# Patient Record
Sex: Female | Born: 1990 | Race: Black or African American | Hispanic: No | Marital: Single | Smoking: Current some day smoker
Health system: Southern US, Community
[De-identification: ages and names within clinical notes are randomized; demographics above are authoritative.]

## PROBLEM LIST (undated history)

## (undated) DIAGNOSIS — G930 Cerebral cysts: Secondary | ICD-10-CM

---

## 2010-11-11 ENCOUNTER — Emergency Department (INDEPENDENT_AMBULATORY_CARE_PROVIDER_SITE_OTHER): Payer: Self-pay

## 2010-11-11 ENCOUNTER — Emergency Department (HOSPITAL_BASED_OUTPATIENT_CLINIC_OR_DEPARTMENT_OTHER)
Admission: EM | Admit: 2010-11-11 | Discharge: 2010-11-11 | Disposition: A | Discharge status: Home / Self Care | Payer: Self-pay | Attending: Emergency Medicine | Admitting: Emergency Medicine

## 2010-11-11 DIAGNOSIS — N390 Urinary tract infection, site not specified: Secondary | ICD-10-CM | POA: Insufficient documentation

## 2010-11-11 DIAGNOSIS — A599 Trichomoniasis, unspecified: Secondary | ICD-10-CM | POA: Insufficient documentation

## 2010-11-11 DIAGNOSIS — R51 Headache: Secondary | ICD-10-CM | POA: Insufficient documentation

## 2010-11-11 DIAGNOSIS — R109 Unspecified abdominal pain: Secondary | ICD-10-CM

## 2010-11-11 LAB — URINE MICROSCOPIC-ADD ON

## 2010-11-11 LAB — URINALYSIS, ROUTINE W REFLEX MICROSCOPIC
Glucose, UA: NEGATIVE mg/dL
Ketones, ur: NEGATIVE mg/dL
pH: 6.5 (ref 5.0–8.0)

## 2011-09-03 ENCOUNTER — Encounter (HOSPITAL_COMMUNITY): Payer: Self-pay | Admitting: Emergency Medicine

## 2011-09-03 ENCOUNTER — Emergency Department (HOSPITAL_COMMUNITY)
Admission: EM | Admit: 2011-09-03 | Discharge: 2011-09-03 | Disposition: A | Discharge status: Home / Self Care | Payer: Self-pay | Attending: Emergency Medicine | Admitting: Emergency Medicine

## 2011-09-03 DIAGNOSIS — X58XXXA Exposure to other specified factors, initial encounter: Secondary | ICD-10-CM | POA: Insufficient documentation

## 2011-09-03 DIAGNOSIS — L03019 Cellulitis of unspecified finger: Secondary | ICD-10-CM

## 2011-09-03 DIAGNOSIS — S6980XA Other specified injuries of unspecified wrist, hand and finger(s), initial encounter: Secondary | ICD-10-CM | POA: Insufficient documentation

## 2011-09-03 DIAGNOSIS — G930 Cerebral cysts: Secondary | ICD-10-CM | POA: Insufficient documentation

## 2011-09-03 DIAGNOSIS — S6990XA Unspecified injury of unspecified wrist, hand and finger(s), initial encounter: Secondary | ICD-10-CM | POA: Insufficient documentation

## 2011-09-03 DIAGNOSIS — F17200 Nicotine dependence, unspecified, uncomplicated: Secondary | ICD-10-CM | POA: Insufficient documentation

## 2011-09-03 HISTORY — DX: Cerebral cysts: G93.0

## 2011-09-03 MED ORDER — IBUPROFEN 800 MG PO TABS: 800.0000 mg | ORAL_TABLET | Freq: Three times a day (TID) | ORAL | Status: AC

## 2011-09-03 MED ORDER — CEPHALEXIN 500 MG PO CAPS: 500.0000 mg | ORAL_CAPSULE | Freq: Four times a day (QID) | ORAL | Status: AC

## 2011-09-03 NOTE — ED Notes (Signed)
Left hand middle finger with slight edema and redness x few days per patient.  Patient states pain increasing into arm and into underarm today.  Patient denies injury.

## 2011-09-03 NOTE — Discharge Instructions (Signed)

## 2011-09-03 NOTE — ED Provider Notes (Signed)
History     CSN: [id number]  Arrival date & time 09/03/11  1603   First MD Initiated Contact with Patient 09/03/11 1805      Chief Complaint  Patient presents with  . Finger Injury    (Consider location/radiation/quality/duration/timing/severity/associated sxs/prior treatment) HPI  Finger pain:  Patient complains of redness and swelling to the left third digit. She is right-handed She did not know if she may have been stung by an insect or bug. She has had no trauma or injuries. She's had no fevers. She's had no weakness, nausea, or vomiting. Does not take anything for her pain because "the pain wasn't bad and " she's had no similar previous symptoms. There was a series small purple mark on the radial side of the left third finger. The swelling has been very, mild there's been no obvious streaking or lymphangitis. Patient believes her tetanus is up-to-date.    Past Medical History  Diagnosis Date  . Brain cyst     History reviewed. No pertinent past surgical history.  History reviewed. No pertinent family history.  History  Substance Use Topics  . Smoking status: Current Some Day Smoker  . Smokeless tobacco: Not on file  . Alcohol Use: Yes     occasional    OB History    Grav Para Term Preterm Abortions TAB SAB Ect Mult Living                  Review of Systems  All other systems reviewed and are negative.    Allergies  Review of patient's allergies indicates no known allergies.  Home Medications   Current Outpatient Rx  Name Route Sig Dispense Refill  . IBUPROFEN 200 MG PO TABS Oral Take 400 mg by mouth every 6 (six) hours as needed. For headaches      BP 138/88  Pulse 103  Temp(Src) 98.8 F (37.1 C) (Oral)  Resp 18  SpO2 96%  Physical Exam  Nursing note and vitals reviewed. Constitutional: She is oriented to person, place, and time. She appears well-developed and well-nourished.  HENT:  Head: Normocephalic and atraumatic.  Eyes: Conjunctivae  and EOM are normal. Pupils are equal, round, and reactive to light.  Neck: Neck supple.  Cardiovascular: Normal rate and regular rhythm.  Exam reveals no gallop and no friction rub.   No murmur heard. Pulmonary/Chest: Breath sounds normal. She has no wheezes. She has no rales. She exhibits no tenderness.  Abdominal: Soft. Bowel sounds are normal. She exhibits no distension. There is no tenderness. There is no rebound and no guarding.  Musculoskeletal: Normal range of motion.       There is very mild erythema to the radial side of the left third digit. There is a very small purple mark, approximately 1 mm. There is no significant edema. There is no obvious streaking or lymphangitis. There is no obvious fluctuance or purulence. No obvious abscess or paronychia. No obvious foreign body. Her range of motion is normal, no bony tenderness. Flexion and extension. His thumb without difficulty. Her pulses are normal.  Neurological: She is alert and oriented to person, place, and time. No cranial nerve deficit. Coordination normal.  Skin: Skin is warm and dry. No rash noted.  Psychiatric: She has a normal mood and affect.    ED Course  Procedures (including critical care time)  Labs Reviewed - No data to display No results found.   No diagnosis found.    MDM  Pt is seen and examined;  Initial history and physical completed.  Will follow.     Very mild erythema to the left third digit. Patient is afebrile. Differential would include mild early cellulitis, insect bite, herpetic whitlow;  range of motion is very normal. There is no obvious fluctuance or drainable fluid collection.  Plan: Will put patient on Keflex and ibuprofen and referred to the hand specialist for followup. This week. She was told to return to ED for any concerns or to be reexamined.      Rosielee Corporan A. Patrica Duel, MD 09/03/11 [id number]

## 2011-09-03 NOTE — ED Notes (Signed)
Pt c/o pain, swelling and redness to 4th digit on left hand with pain up left arm; pt sts noted sore on finger several days ago

## 2012-03-31 ENCOUNTER — Other Ambulatory Visit (HOSPITAL_COMMUNITY)
Admission: RE | Admit: 2012-03-31 | Discharge: 2012-03-31 | Disposition: A | Discharge status: Home / Self Care | Payer: 59 | Source: Ambulatory Visit | Attending: Obstetrics & Gynecology | Admitting: Obstetrics & Gynecology

## 2012-03-31 DIAGNOSIS — N760 Acute vaginitis: Secondary | ICD-10-CM | POA: Insufficient documentation

## 2012-03-31 DIAGNOSIS — Z113 Encounter for screening for infections with a predominantly sexual mode of transmission: Secondary | ICD-10-CM | POA: Insufficient documentation

## 2012-03-31 DIAGNOSIS — Z124 Encounter for screening for malignant neoplasm of cervix: Secondary | ICD-10-CM | POA: Insufficient documentation

## 2012-06-04 IMAGING — CR DG CHEST 2V
2 series · 2 of 2 positions shown · non-contrast
Comparison: None available.

CLINICAL DATA: Right-sided flank pain.

CHEST - 2 VIEW

[w chest pa]
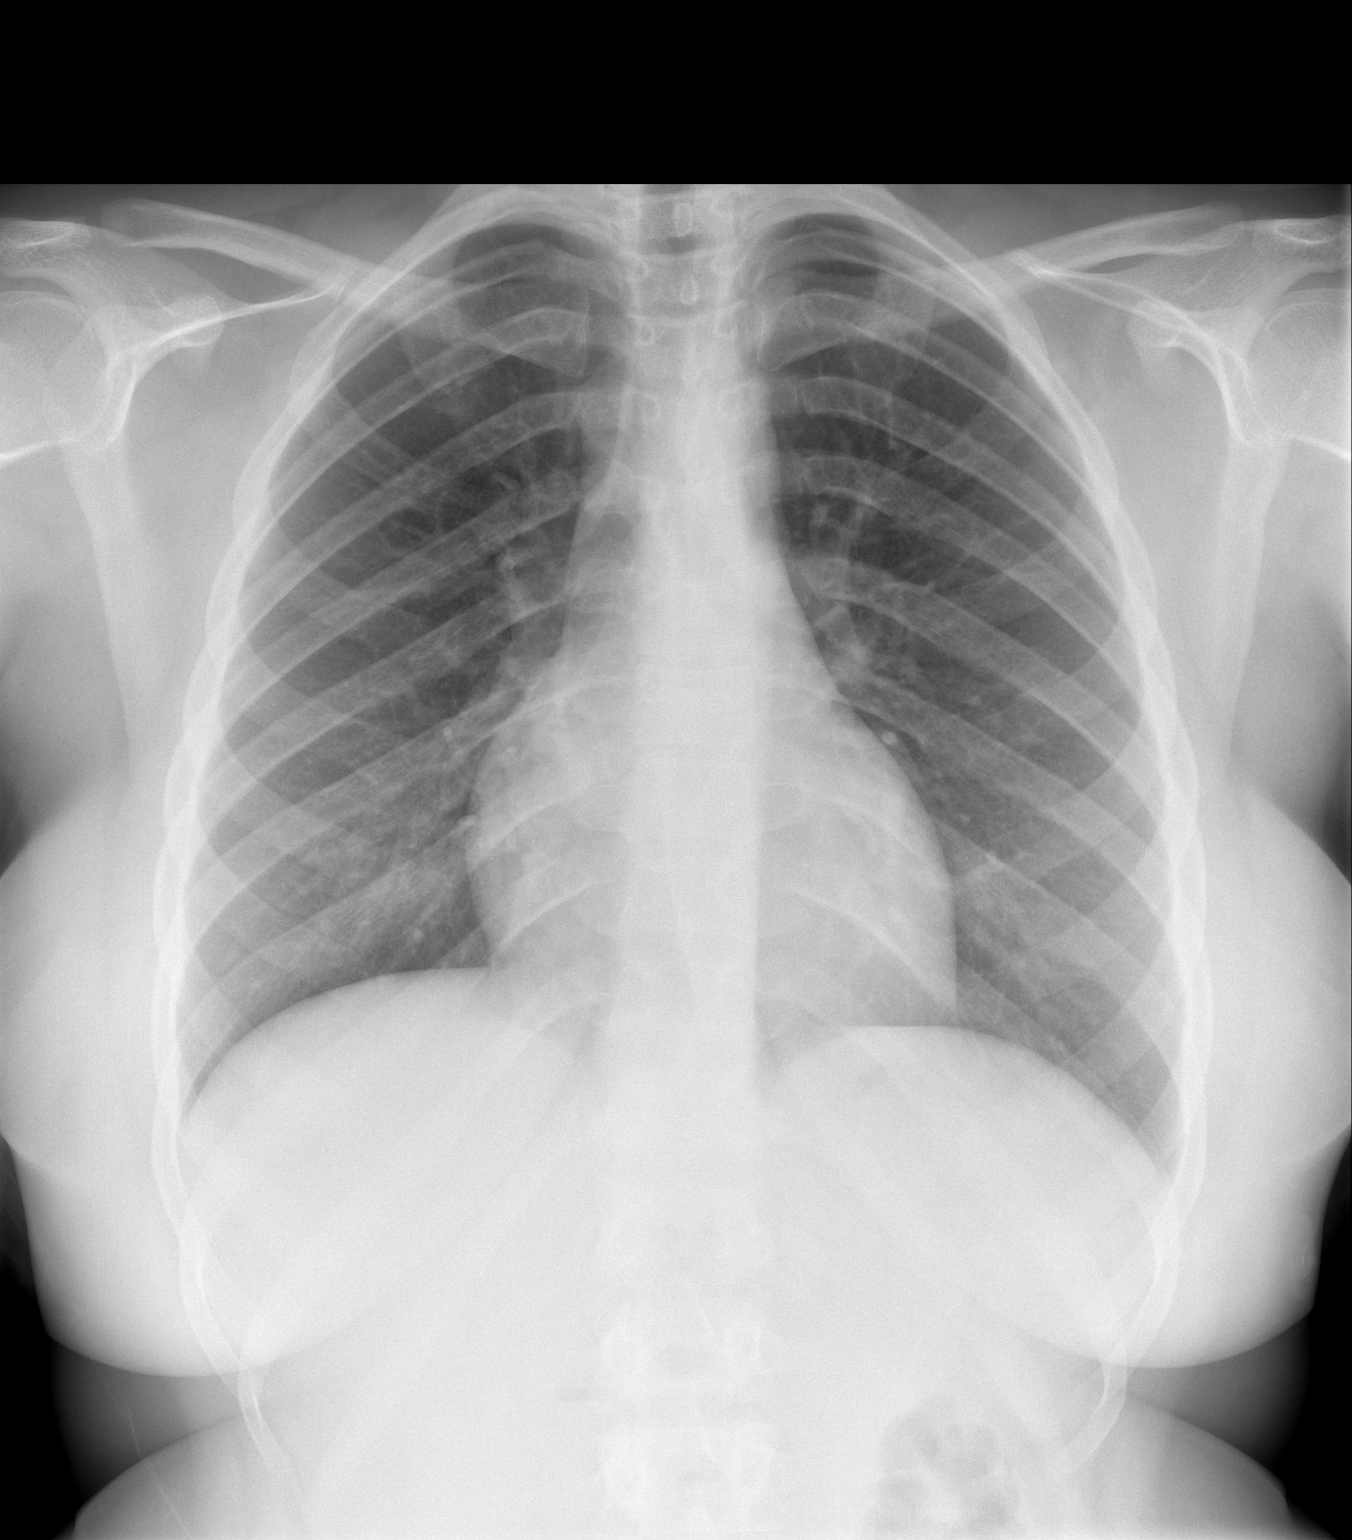

[w chest lat]
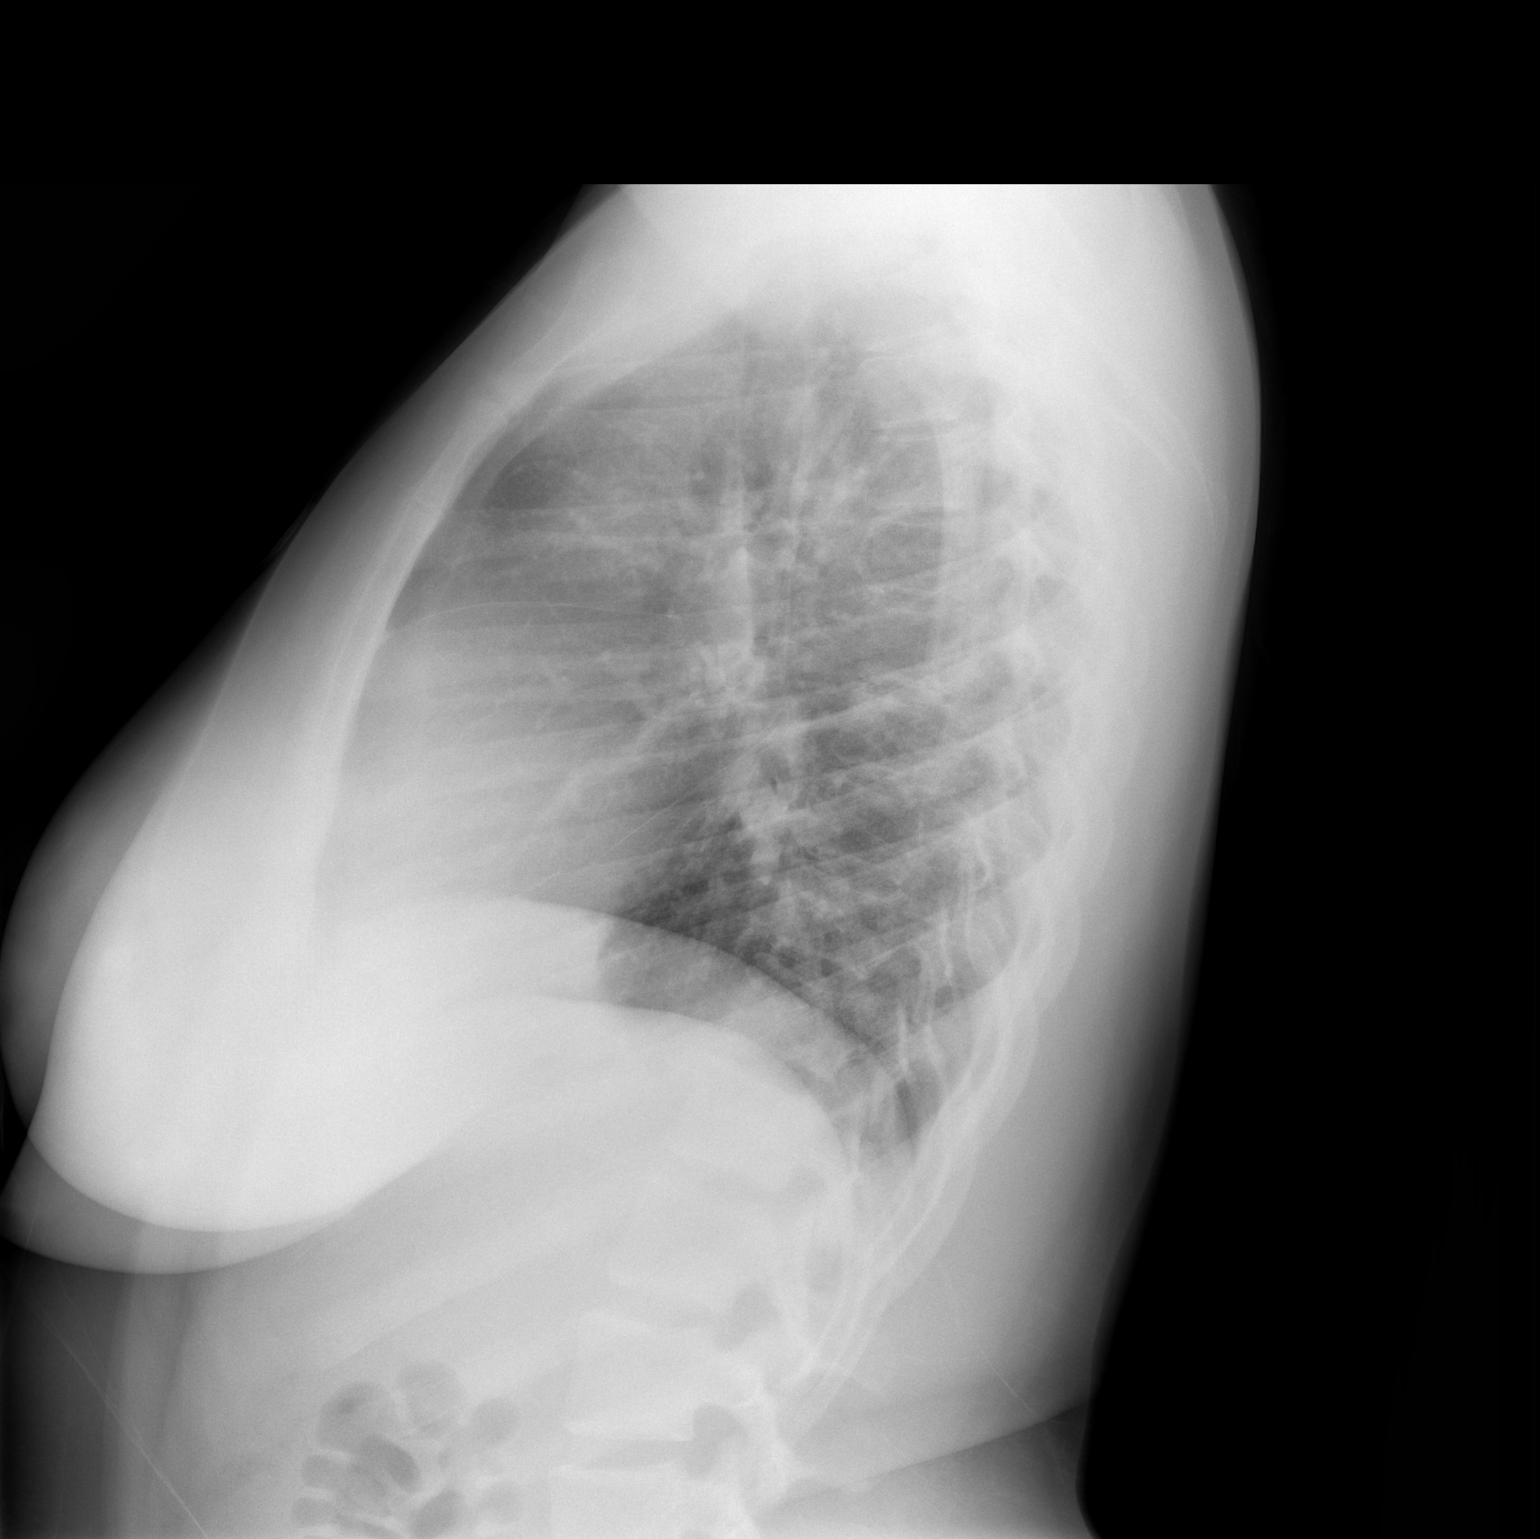

[2 of 2 positions shown; findings below may reference images not displayed]

FINDINGS: The heart size is normal.  The lungs are clear.  The
visualized soft tissues and bony thorax are unremarkable.
IMPRESSION: Negative chest.

## 2013-09-24 ENCOUNTER — Encounter (HOSPITAL_BASED_OUTPATIENT_CLINIC_OR_DEPARTMENT_OTHER): Payer: Self-pay | Admitting: Emergency Medicine

## 2013-09-24 DIAGNOSIS — M546 Pain in thoracic spine: Secondary | ICD-10-CM | POA: Insufficient documentation

## 2013-09-24 DIAGNOSIS — R21 Rash and other nonspecific skin eruption: Secondary | ICD-10-CM | POA: Insufficient documentation

## 2013-09-24 DIAGNOSIS — Z79899 Other long term (current) drug therapy: Secondary | ICD-10-CM | POA: Insufficient documentation

## 2013-09-24 DIAGNOSIS — N644 Mastodynia: Secondary | ICD-10-CM | POA: Insufficient documentation

## 2013-09-24 DIAGNOSIS — Z3202 Encounter for pregnancy test, result negative: Secondary | ICD-10-CM | POA: Insufficient documentation

## 2013-09-24 DIAGNOSIS — R071 Chest pain on breathing: Secondary | ICD-10-CM | POA: Insufficient documentation

## 2013-09-24 DIAGNOSIS — F17200 Nicotine dependence, unspecified, uncomplicated: Secondary | ICD-10-CM | POA: Insufficient documentation

## 2013-09-24 DIAGNOSIS — Z8669 Personal history of other diseases of the nervous system and sense organs: Secondary | ICD-10-CM | POA: Insufficient documentation

## 2013-09-24 DIAGNOSIS — Z792 Long term (current) use of antibiotics: Secondary | ICD-10-CM | POA: Insufficient documentation

## 2013-09-24 LAB — PREGNANCY, URINE: PREG TEST UR: NEGATIVE

## 2013-09-24 NOTE — ED Notes (Signed)
Left upper back and shoulder pain with tenderness x2 weeks.  Pain traveled to left rib area and around left breast 2-3 days ago. Tender to touch but no increase in pain with movement.

## 2013-09-25 ENCOUNTER — Emergency Department (HOSPITAL_BASED_OUTPATIENT_CLINIC_OR_DEPARTMENT_OTHER)
Admission: EM | Admit: 2013-09-25 | Discharge: 2013-09-25 | Disposition: A | Discharge status: Home / Self Care | Payer: No Typology Code available for payment source | Attending: Emergency Medicine | Admitting: Emergency Medicine

## 2013-09-25 ENCOUNTER — Emergency Department (HOSPITAL_BASED_OUTPATIENT_CLINIC_OR_DEPARTMENT_OTHER): Payer: No Typology Code available for payment source

## 2013-09-25 DIAGNOSIS — R0789 Other chest pain: Secondary | ICD-10-CM

## 2013-09-25 MED ORDER — METHOCARBAMOL 500 MG PO TABS: 500.0000 mg | ORAL_TABLET | Freq: Two times a day (BID) | ORAL | Status: DC

## 2013-09-25 MED ORDER — IBUPROFEN 600 MG PO TABS: 600.0000 mg | ORAL_TABLET | Freq: Four times a day (QID) | ORAL | Status: DC | PRN

## 2013-09-25 MED ORDER — DOXYCYCLINE HYCLATE 100 MG PO CAPS: 100.0000 mg | ORAL_CAPSULE | Freq: Two times a day (BID) | ORAL | Status: DC

## 2013-09-25 NOTE — Discharge Instructions (Signed)
We saw you in the ER for the chest pain. All the results in the ER are normal. WE SUSPECT THAT YOUR PAIN IS COMING FROM THE SKIN OR THE SOFT TISSUE OF THE BREAST. Take the meds prescribed. Ice the breast tissue 2-3 times a day. The workup in the ER is not complete, and is limited to screening for life threatening and emergent conditions only, so please see a primary care doctor for further evaluation.   Chest Wall Pain Chest wall pain is pain felt in or around the chest bones and muscles. It may take up to 6 weeks to get better. It may take longer if you are active. Chest wall pain can happen on its own. Other times, things like germs, injury, coughing, or exercise can cause the pain. HOME CARE   Avoid activities that make you tired or cause pain. Try not to use your chest, belly (abdominal), or side muscles. Do not use heavy weights.  Put ice on the sore area.  Put ice in a plastic bag.  Place a towel between your skin and the bag.  Leave the ice on for 15-20 minutes for the first 2 days.  Only take medicine as told by your doctor. GET HELP RIGHT AWAY IF:   You have more pain or are very uncomfortable.  You have a fever.  Your chest pain gets worse.  You have new problems.  You feel sick to your stomach (nauseous) or throw up (vomit).  You start to sweat or feel lightheaded.  You have a cough with mucus (phlegm).  You cough up blood. MAKE SURE YOU:   Understand these instructions.  Will watch your condition.  Will get help right away if you are not doing well or get worse. Document Released: 11/27/2007 Document Revised: 09/02/2011 Document Reviewed: 02/04/2011 [facility] Patient Information 2014 [place], [place]. Mastitis  Mastitis is inflammation of the breast tissue. It occurs most often in women who are breastfeeding, but it can also affect other women, and even sometimes men. CAUSES  Mastitis is usually caused by a bacterial infection. Bacteria enter the breast  tissue through cuts or openings in the skin. Typically, this occurs with breastfeeding because of cracked or irritated skin. Sometimes, it can occur even when there is no opening in the skin. It can be associated with plugged milk (lactiferous) ducts. Nipple piercing can also lead to mastitis. Also, some forms of breast cancer can cause mastitis. SIGNS AND SYMPTOMS   Swelling, redness, tenderness, and pain in an area of the breast.  Swelling of the glands under the arm on the same side.  Fever. If an infection is allowed to progress, a collection of pus (abscess) may develop. DIAGNOSIS  Your health care provider can usually diagnose mastitis based on your symptoms and a physical exam. Tests may be done to help confirm the diagnosis. These may include:   Removal of pus from the breast by applying pressure to the area. This pus can be examined in the lab to determine which bacteria are present. If an abscess has developed, the fluid in the abscess can be removed with a needle. This can also be used to confirm the diagnosis and determine the bacteria present. In most cases, pus will not be present.  Blood tests to determine if your body is fighting a bacterial infection.  Mammogram or ultrasound tests to rule out other problems or diseases. TREATMENT  Antibiotic medicine is used to treat a bacterial infection. Your health care provider will determine  which bacteria are most likely causing the infection and will select an appropriate antibiotic. This is sometimes changed based on the results of tests performed to identify the bacteria, or if there is no response to the antibiotic selected. Antibiotics are usually given by mouth. You may also be given medicine for pain. Mastitis that occurs with breastfeeding will sometimes go away on its own, so your health care provider may choose to wait 24 hours after first seeing you to decide whether a prescription medicine is needed. HOME CARE INSTRUCTIONS    Only take over-the-counter or prescription medicines for pain, fever, or discomfort as directed by your health care provider.  If your health care provider prescribed an antibiotic, take the medicine as directed. Make sure you finish it even if you start to feel better.  Do not wear a tight or underwire bra. Wear a soft, supportive bra.  Increase your fluid intake, especially if you have a fever.  Women who are breastfeeding should follow these instructions:  Continue to empty the breast. Your health care provider can tell you whether this milk is safe for your infant or needs to be thrown out. You may be told to stop nursing until your health care provider thinks it is safe for your baby. Use a breast pump if you are advised to stop nursing.  Keep your nipples clean and dry.  Empty the first breast completely before going to the other breast. If your baby is not emptying your breasts completely for some reason, use a breast pump to empty your breasts.  If you go back to work, pump your breasts while at work to stay in time with your nursing schedule.  Avoid allowing your breasts to become overly filled with milk (engorged). SEEK MEDICAL CARE IF:   You have pus-like discharge from the breast.  Your symptoms do not improve with the treatment prescribed by your health care provider within 2 days. SEEK IMMEDIATE MEDICAL CARE IF:   Your pain and swelling are getting worse.  You have pain that is not controlled with medicine.  You have a red line extending from the breast toward your armpit.  You have a fever or persistent symptoms for more than 2 3 days.  You have a fever and your symptoms suddenly get worse. Document Released: 06/10/2005 Document Revised: 03/31/2013 Document Reviewed: 01/08/2013 [facility] Patient Information 2014 [employer].

## 2013-09-25 NOTE — ED Provider Notes (Signed)
CSN: [id number]   [id number]  Arrival date & time 09/24/13  2328 History   First MD Initiated Contact with Patient 09/25/13 0236     Chief Complaint  Patient presents with  . chest wall pain      (Consider location/radiation/quality/duration/timing/severity/associated sxs/prior Treatment) HPI Comments: Pt comes in with cc of left upper back pain (2 weeks) and left breast pain x 2 days. Pain is constant, sharp. The breast appears swollen to the patient and warmer. She had a rink on her nipple, which she has removed. She reports some skin disruption to the nipple but that it is healing. There is no n/v/f/c. Pt has no lung dz. There is no dib. No hx of DVT, PE.  The history is provided by the patient.    Past Medical History  Diagnosis Date  . Brain cyst    History reviewed. No pertinent past surgical history. No family history on file. History  Substance Use Topics  . Smoking status: Current Every Day Smoker    Types: Cigars  . Smokeless tobacco: Not on file  . Alcohol Use: Yes     Comment: occasional   OB History   Grav Para Term Preterm Abortions TAB SAB Ect Mult Living                 Review of Systems  Constitutional: Negative for activity change.  Respiratory: Negative for shortness of breath.   Cardiovascular: Positive for chest pain.  Gastrointestinal: Negative for nausea, vomiting and abdominal pain.  Genitourinary: Negative for dysuria.  Musculoskeletal: Negative for neck pain.  Skin: Positive for rash and wound.  Neurological: Negative for headaches.  All other systems reviewed and are negative.      Allergies  Review of patient's allergies indicates no known allergies.  Home Medications   Current Outpatient Rx  Name  Route  Sig  Dispense  Refill  . doxycycline (VIBRAMYCIN) 100 MG capsule   Oral   Take 1 capsule (100 mg total) by mouth 2 (two) times daily.   14 capsule   0   . ibuprofen (ADVIL,MOTRIN) 200 MG tablet   Oral   Take 400 mg by mouth every 6  (six) hours as needed. For headaches         . ibuprofen (ADVIL,MOTRIN) 600 MG tablet   Oral   Take 1 tablet (600 mg total) by mouth every 6 (six) hours as needed.   20 tablet   0   . methocarbamol (ROBAXIN) 500 MG tablet   Oral   Take 1 tablet (500 mg total) by mouth 2 (two) times daily.   20 tablet   0    BP 133/82  Pulse 71  Temp(Src) 99.2 F (37.3 C) (Oral)  Resp 18  Ht 5\' 11"  (1.803 m)  Wt 229 lb (103.874 kg)  BMI 31.95 kg/m2  SpO2 99%  LMP 09/17/2013 Physical Exam  Nursing note and vitals reviewed. Constitutional: She is oriented to person, place, and time. She appears well-developed and well-nourished.  HENT:  Head: Normocephalic and atraumatic.  Eyes: EOM are normal. Pupils are equal, round, and reactive to light.  Neck: Neck supple.  Cardiovascular: Normal rate, regular rhythm and normal heart sounds.   No murmur heard. Pulmonary/Chest: Effort normal. No respiratory distress.  Abdominal: Soft. She exhibits no distension. There is no tenderness. There is no rebound and no guarding.  Musculoskeletal:  Left scapular spasm w/ reproducible tenderness.  Neurological: She is alert and oriented to person, place, and time.  Skin: Skin is warm and dry.  Left breast tissue - nipple has some excoriation, no active drainage. There is some erythema surrounding the areola. No axillary lymphadenopathy.    ED Course  Procedures (including critical care time) Labs Review Labs Reviewed  PREGNANCY, URINE   Imaging Review Dg Chest 2 View  09/25/2013   CLINICAL DATA:  Left upper back pain, radiating to the left side of the chest. History of smoking.  EXAM: CHEST  2 VIEW  COMPARISON:  Chest radiograph performed 11/11/2010  FINDINGS: The lungs are well-aerated and clear. There is no evidence of focal opacification, pleural effusion or pneumothorax.  The heart is normal in size; the mediastinal contour is within normal limits. No acute osseous abnormalities are seen. A right-sided  metallic nipple piercing is noted.  IMPRESSION: No acute cardiopulmonary process seen.   Electronically Signed   By: Roanna Raider M.D.   On: 09/25/2013 00:50     EKG Interpretation None      MDM   Final diagnoses:  Chest wall pain  Possible Mastitis Muscle Spasms  Pt comes in with cc of left scapular pain and left breast pain. The pain is muscular or is coming from the skin/soft tissue. Dont suspect the pain to be from the cardiac or pulm etiology based on clear hx, and normal exam/  ? Mastitis - based on the sx. Will give doxy. Pt advised to see PCP in 1 week. Scapular pain is likely from spasms, as it is clearly reproduced and there is palpable muscle spasms.  Derwood Kaplan, MD 09/25/13 [phone]

## 2014-06-15 ENCOUNTER — Encounter (HOSPITAL_BASED_OUTPATIENT_CLINIC_OR_DEPARTMENT_OTHER): Payer: Self-pay | Admitting: Emergency Medicine

## 2014-06-15 ENCOUNTER — Emergency Department (HOSPITAL_BASED_OUTPATIENT_CLINIC_OR_DEPARTMENT_OTHER)
Admission: EM | Admit: 2014-06-15 | Discharge: 2014-06-15 | Disposition: A | Discharge status: Home / Self Care | Payer: No Typology Code available for payment source | Attending: Emergency Medicine | Admitting: Emergency Medicine

## 2014-06-15 DIAGNOSIS — Z79899 Other long term (current) drug therapy: Secondary | ICD-10-CM | POA: Insufficient documentation

## 2014-06-15 DIAGNOSIS — Y998 Other external cause status: Secondary | ICD-10-CM | POA: Insufficient documentation

## 2014-06-15 DIAGNOSIS — Z8669 Personal history of other diseases of the nervous system and sense organs: Secondary | ICD-10-CM | POA: Insufficient documentation

## 2014-06-15 DIAGNOSIS — Y9389 Activity, other specified: Secondary | ICD-10-CM | POA: Insufficient documentation

## 2014-06-15 DIAGNOSIS — Z792 Long term (current) use of antibiotics: Secondary | ICD-10-CM | POA: Insufficient documentation

## 2014-06-15 DIAGNOSIS — S0083XA Contusion of other part of head, initial encounter: Secondary | ICD-10-CM | POA: Insufficient documentation

## 2014-06-15 DIAGNOSIS — Z720 Tobacco use: Secondary | ICD-10-CM | POA: Insufficient documentation

## 2014-06-15 DIAGNOSIS — Y92410 Unspecified street and highway as the place of occurrence of the external cause: Secondary | ICD-10-CM | POA: Insufficient documentation
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
## 2014-06-15 NOTE — ED Notes (Signed)
Pt ambulating independently w/ steady gait on d/c in no acute distress, A&Ox4. D/c instructions reviewed w/ pt - pt denies any further questions or concerns at present.  
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
## 2014-07-26 ENCOUNTER — Encounter (HOSPITAL_BASED_OUTPATIENT_CLINIC_OR_DEPARTMENT_OTHER): Payer: Self-pay | Admitting: *Deleted

## 2014-07-26 ENCOUNTER — Emergency Department (HOSPITAL_BASED_OUTPATIENT_CLINIC_OR_DEPARTMENT_OTHER)
Admission: EM | Admit: 2014-07-26 | Discharge: 2014-07-26 | Disposition: A | Discharge status: Home / Self Care | Payer: No Typology Code available for payment source | Attending: Emergency Medicine | Admitting: Emergency Medicine

## 2014-07-26 DIAGNOSIS — Z79899 Other long term (current) drug therapy: Secondary | ICD-10-CM | POA: Diagnosis not present

## 2014-07-26 DIAGNOSIS — Z792 Long term (current) use of antibiotics: Secondary | ICD-10-CM | POA: Diagnosis not present

## 2014-07-26 DIAGNOSIS — Z8669 Personal history of other diseases of the nervous system and sense organs: Secondary | ICD-10-CM | POA: Insufficient documentation

## 2014-07-26 DIAGNOSIS — Z720 Tobacco use: Secondary | ICD-10-CM | POA: Diagnosis not present

## 2014-07-26 DIAGNOSIS — M62830 Muscle spasm of back: Secondary | ICD-10-CM | POA: Diagnosis not present

## 2014-07-26 DIAGNOSIS — M545 Low back pain: Secondary | ICD-10-CM | POA: Diagnosis present

## 2014-07-26 MED ORDER — IBUPROFEN 400 MG PO TABS
600.0000 mg | ORAL_TABLET | Freq: Once | ORAL | Status: AC
  Administered 2014-07-26: 600 mg via ORAL
  Filled 2014-07-26 (×2): qty 1

## 2014-07-26 MED ORDER — CYCLOBENZAPRINE HCL 5 MG PO TABS: 5.0000 mg | ORAL_TABLET | Freq: Three times a day (TID) | ORAL | Status: AC | PRN

## 2014-07-26 MED ORDER — HYDROCODONE-ACETAMINOPHEN 5-325 MG PO TABS: 1.0000 | ORAL_TABLET | Freq: Four times a day (QID) | ORAL | Status: AC | PRN
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
## 2014-07-26 NOTE — Discharge Instructions (Signed)
Take Aleve, available over-the-counter as directed. Heat Therapy Heat therapy can help ease sore, stiff, injured, and tight muscles and joints. Heat relaxes your muscles, which may help ease your pain.  RISKS AND COMPLICATIONS If you have any of the following conditions, do not use heat therapy unless your health care provider has approved:  Poor circulation.  Healing wounds or scarred skin in the area being treated.  Diabetes, heart disease, or high blood pressure.  Not being able to feel (numbness) the area being treated.  Unusual swelling of the area being treated.  Active infections.  Blood clots.  Cancer.  Inability to communicate pain. This may include young children and people who have problems with their brain function (dementia).  Pregnancy. Heat therapy should only be used on old, pre-existing, or long-lasting (chronic) injuries. Do not use heat therapy on new injuries unless directed by your health care provider. HOW TO USE HEAT THERAPY There are several different kinds of heat therapy, including:  Moist heat pack.  Warm water bath.  Hot water bottle.  Electric heating pad.  Heated gel pack.  Heated wrap.  Electric heating pad. Use the heat therapy method suggested by your health care provider. Follow your health care provider's instructions on when and how to use heat therapy. GENERAL HEAT THERAPY RECOMMENDATIONS  Do not sleep while using heat therapy. Only use heat therapy while you are awake.  Your skin may turn pink while using heat therapy. Do not use heat therapy if your skin turns red.  Do not use heat therapy if you have new pain.  High heat or long exposure to heat can cause burns. Be careful when using heat therapy to avoid burning your skin.  Do not use heat therapy on areas of your skin that are already irritated, such as with a rash or sunburn. SEEK MEDICAL CARE IF:  You have blisters, redness, swelling, or numbness.  You have new  pain.  Your pain is worse. MAKE SURE YOU:  Understand these instructions.  Will watch your condition.  Will get help right away if you are not doing well or get worse. Document Released: 09/02/2011 Document Revised: 10/25/2013 Document Reviewed: 08/03/2013 [facility] Patient Information 2015 [place], [place]. This information is not intended to replace advice given to you by your health care provider. Make sure you discuss any questions you have with your health care provider.  Lumbosacral Strain Lumbosacral strain is a strain of any of the parts that make up your lumbosacral vertebrae. Your lumbosacral vertebrae are the bones that make up the lower third of your backbone. Your lumbosacral vertebrae are held together by muscles and tough, fibrous tissue (ligaments).  CAUSES  A sudden blow to your back can cause lumbosacral strain. Also, anything that causes an excessive stretch of the muscles in the low back can cause this strain. This is typically seen when people exert themselves strenuously, fall, lift heavy objects, bend, or crouch repeatedly. RISK FACTORS  Physically demanding work.  Participation in pushing or pulling sports or sports that require a sudden twist of the back (tennis, golf, baseball).  Weight lifting.  Excessive lower back curvature.  Forward-tilted pelvis.  Weak back or abdominal muscles or both.  Tight hamstrings. SIGNS AND SYMPTOMS  Lumbosacral strain may cause pain in the area of your injury or pain that moves (radiates) down your leg.  DIAGNOSIS Your health care provider can often diagnose lumbosacral strain through a physical exam. In some cases, you may need tests such as X-ray  exams.  TREATMENT  Treatment for your lower back injury depends on many factors that your clinician will have to evaluate. However, most treatment will include the use of anti-inflammatory medicines. HOME CARE INSTRUCTIONS   Avoid hard physical activities (tennis, racquetball,  waterskiing) if you are not in proper physical condition for it. This may aggravate or create problems.  If you have a back problem, avoid sports requiring sudden body movements. Swimming and walking are generally safer activities.  Maintain good posture.  Maintain a healthy weight.  For acute conditions, you may put ice on the injured area.  Put ice in a plastic bag.  Place a towel between your skin and the bag.  Leave the ice on for 20 minutes, 2-3 times a day.  When the low back starts healing, stretching and strengthening exercises may be recommended. SEEK MEDICAL CARE IF:  Your back pain is getting worse.  You experience severe back pain not relieved with medicines. SEEK IMMEDIATE MEDICAL CARE IF:   You have numbness, tingling, weakness, or problems with the use of your arms or legs.  There is a change in bowel or bladder control.  You have increasing pain in any area of the body, including your belly (abdomen).  You notice shortness of breath, dizziness, or feel faint.  You feel sick to your stomach (nauseous), are throwing up (vomiting), or become sweaty.  You notice discoloration of your toes or legs, or your feet get very cold. MAKE SURE YOU:   Understand these instructions.  Will watch your condition.  Will get help right away if you are not doing well or get worse. Document Released: 03/20/2005 Document Revised: 06/15/2013 Document Reviewed: 01/27/2013 [facility] Patient Information 2015 [place], [place]. This information is not intended to replace advice given to you by your health care provider. Make sure you discuss any questions you have with your health care provider.
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
## 2014-09-11 ENCOUNTER — Encounter (HOSPITAL_BASED_OUTPATIENT_CLINIC_OR_DEPARTMENT_OTHER): Payer: Self-pay | Admitting: Emergency Medicine

## 2014-09-11 ENCOUNTER — Emergency Department (HOSPITAL_BASED_OUTPATIENT_CLINIC_OR_DEPARTMENT_OTHER)
Admission: EM | Admit: 2014-09-11 | Discharge: 2014-09-11 | Disposition: A | Discharge status: Home / Self Care | Payer: No Typology Code available for payment source | Attending: Emergency Medicine | Admitting: Emergency Medicine

## 2014-09-11 DIAGNOSIS — Z720 Tobacco use: Secondary | ICD-10-CM | POA: Insufficient documentation

## 2014-09-11 DIAGNOSIS — Z8669 Personal history of other diseases of the nervous system and sense organs: Secondary | ICD-10-CM | POA: Insufficient documentation

## 2014-09-11 DIAGNOSIS — Y998 Other external cause status: Secondary | ICD-10-CM | POA: Insufficient documentation

## 2014-09-11 DIAGNOSIS — S79922A Unspecified injury of left thigh, initial encounter: Secondary | ICD-10-CM | POA: Insufficient documentation

## 2014-09-11 DIAGNOSIS — M79605 Pain in left leg: Secondary | ICD-10-CM

## 2014-09-11 DIAGNOSIS — R109 Unspecified abdominal pain: Secondary | ICD-10-CM

## 2014-09-11 DIAGNOSIS — S299XXA Unspecified injury of thorax, initial encounter: Secondary | ICD-10-CM | POA: Insufficient documentation

## 2014-09-11 DIAGNOSIS — S0993XA Unspecified injury of face, initial encounter: Secondary | ICD-10-CM | POA: Insufficient documentation

## 2014-09-11 DIAGNOSIS — Y9389 Activity, other specified: Secondary | ICD-10-CM | POA: Insufficient documentation

## 2014-09-11 DIAGNOSIS — Y92410 Unspecified street and highway as the place of occurrence of the external cause: Secondary | ICD-10-CM | POA: Insufficient documentation

## 2014-09-11 DIAGNOSIS — S3991XA Unspecified injury of abdomen, initial encounter: Secondary | ICD-10-CM | POA: Insufficient documentation
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
## 2014-09-11 NOTE — Discharge Instructions (Signed)
Please follow-up with primary care provider in one week for recheck if symptoms to consider. If you experience new or worsening symptoms please seek immediate medical evaluation. Ibuprofen, ice, and rest  as needed for pain.
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
